# Patient Record
Sex: Female | Born: 1965
Health system: Southern US, Community
[De-identification: ages and names within clinical notes are randomized; demographics above are authoritative.]

## PROBLEM LIST (undated history)

## (undated) ENCOUNTER — Emergency Department: Admission: EM | Payer: Self-pay | Source: Home / Self Care

## (undated) DIAGNOSIS — D649 Anemia, unspecified: Secondary | ICD-10-CM

## (undated) DIAGNOSIS — T300 Burn of unspecified body region, unspecified degree: Secondary | ICD-10-CM

## (undated) HISTORY — PX: APPENDECTOMY: SHX54

## (undated) HISTORY — PX: WISDOM TOOTH EXTRACTION: SHX21

## (undated) HISTORY — PX: BURN DEBRIDEMENT SURGERY: SHX582

---

## 1998-03-11 ENCOUNTER — Other Ambulatory Visit: Admission: RE | Admit: 1998-03-11 | Discharge: 1998-03-11 | Payer: Self-pay | Admitting: Obstetrics

## 1999-08-25 ENCOUNTER — Other Ambulatory Visit: Admission: RE | Admit: 1999-08-25 | Discharge: 1999-08-25 | Payer: Self-pay | Admitting: Obstetrics

## 2000-10-05 ENCOUNTER — Encounter: Payer: Self-pay | Admitting: Obstetrics and Gynecology

## 2000-10-05 ENCOUNTER — Encounter: Admission: RE | Admit: 2000-10-05 | Discharge: 2000-10-05 | Payer: Self-pay | Admitting: Obstetrics and Gynecology

## 2000-12-08 ENCOUNTER — Encounter: Admission: RE | Admit: 2000-12-08 | Discharge: 2000-12-08 | Payer: Self-pay | Admitting: Obstetrics and Gynecology

## 2000-12-08 ENCOUNTER — Encounter: Payer: Self-pay | Admitting: Obstetrics and Gynecology

## 2001-05-12 ENCOUNTER — Encounter: Admission: RE | Admit: 2001-05-12 | Discharge: 2001-05-12 | Payer: Self-pay | Admitting: Obstetrics and Gynecology

## 2001-05-12 ENCOUNTER — Encounter: Payer: Self-pay | Admitting: Obstetrics and Gynecology

## 2001-05-12 ENCOUNTER — Observation Stay (HOSPITAL_COMMUNITY): Admission: EM | Admit: 2001-05-12 | Discharge: 2001-05-13 | Payer: Self-pay | Admitting: Emergency Medicine

## 2001-05-12 ENCOUNTER — Encounter (INDEPENDENT_AMBULATORY_CARE_PROVIDER_SITE_OTHER): Payer: Self-pay | Admitting: Specialist

## 2001-05-30 ENCOUNTER — Ambulatory Visit (HOSPITAL_COMMUNITY): Admission: RE | Admit: 2001-05-30 | Discharge: 2001-05-30 | Payer: Self-pay | Admitting: Obstetrics and Gynecology

## 2001-05-30 ENCOUNTER — Encounter (INDEPENDENT_AMBULATORY_CARE_PROVIDER_SITE_OTHER): Payer: Self-pay | Admitting: Specialist

## 2002-01-12 HISTORY — PX: APPENDECTOMY: SHX54

## 2002-03-14 ENCOUNTER — Other Ambulatory Visit: Admission: RE | Admit: 2002-03-14 | Discharge: 2002-03-14 | Payer: Self-pay | Admitting: Obstetrics and Gynecology

## 2002-11-06 ENCOUNTER — Other Ambulatory Visit: Admission: RE | Admit: 2002-11-06 | Discharge: 2002-11-06 | Payer: Self-pay | Admitting: Obstetrics and Gynecology

## 2003-01-13 HISTORY — PX: OTHER SURGICAL HISTORY: SHX169

## 2003-04-20 ENCOUNTER — Other Ambulatory Visit: Admission: RE | Admit: 2003-04-20 | Discharge: 2003-04-20 | Payer: Self-pay | Admitting: Obstetrics and Gynecology

## 2004-05-28 ENCOUNTER — Other Ambulatory Visit: Admission: RE | Admit: 2004-05-28 | Discharge: 2004-05-28 | Payer: Self-pay | Admitting: Obstetrics and Gynecology

## 2005-06-24 ENCOUNTER — Other Ambulatory Visit: Admission: RE | Admit: 2005-06-24 | Discharge: 2005-06-24 | Payer: Self-pay | Admitting: Obstetrics and Gynecology

## 2006-07-07 ENCOUNTER — Other Ambulatory Visit: Admission: RE | Admit: 2006-07-07 | Discharge: 2006-07-07 | Payer: Self-pay | Admitting: Obstetrics and Gynecology

## 2007-06-22 ENCOUNTER — Encounter: Admission: RE | Admit: 2007-06-22 | Discharge: 2007-06-22 | Payer: Self-pay | Admitting: Obstetrics and Gynecology

## 2007-08-11 ENCOUNTER — Other Ambulatory Visit: Admission: RE | Admit: 2007-08-11 | Discharge: 2007-08-11 | Payer: Self-pay | Admitting: Obstetrics and Gynecology

## 2008-09-25 ENCOUNTER — Other Ambulatory Visit: Admission: RE | Admit: 2008-09-25 | Discharge: 2008-09-25 | Payer: Self-pay | Admitting: Obstetrics and Gynecology

## 2009-10-01 ENCOUNTER — Other Ambulatory Visit: Admission: RE | Admit: 2009-10-01 | Discharge: 2009-10-01 | Payer: Self-pay | Admitting: Obstetrics and Gynecology
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
## 2010-05-30 NOTE — Op Note (Signed)
[facility]  Patient:    MIYUKI, RZASA Visit Number: [id number] MRN: [id number]          Service Type: DSU Location: [facility] Attending Physician:  Madelyn Flavors Dictated by:   Beather Arbour Thomasena Edis, M.D. Proc. Date: 05/30/01 Admit Date:  05/30/2001   CC:         Dr. Judyann Munson; [facility] [place]  Currie Paris, M.D.  Georgina Peer, M.D.   Operative Report  PREOPERATIVE DIAGNOSES:       Prolapsing fibroid, menorrhagia, anemia secondary to menorrhagia.  POSTOPERATIVE DIAGNOSES:      Prolapsing fibroid, menorrhagia, anemia secondary to menorrhagia.  PROCEDURE:                    Vaginal myomectomy.  SURGEON:                      Beather Arbour. Thomasena Edis, M.D.  ASSISTANT:                    Georgina Peer, M.D.  ESTIMATED BLOOD LOSS:         50 cc.  FLUIDS:                       1700 cc of Crystalloid and 1 g of cefazolin IV preoperatively.  COMPLICATIONS:                None.  DRAINS:                       Foley.  ANESTHESIA:                   General endotracheal.  DESCRIPTION OF OPERATION:     Patient was brought to the operating room, identified on the operating table.  After induction of adequate general endotracheal anesthesia the patient was placed in [facility] stirrups and prepped and draped in the usual sterile fashion.  She was prepped for an abdominal case as well as a vaginal case.  A Foley catheter was placed.  Vaginal wall retractors were placed.  The prolapsing fibroid was again identified and the base of the stalk was infiltrated with 10 cc of 1:100,000 lidocaine with epinephrine taking care to not inject intravascularly.  With excellent exposure, four ligating sutures were placed.  A suture was placed from 12 to 6 and then from 6 to 12 in the vertical plane and then superiorly at the 12 oclock position and then at the 6 oclock position such that the stalk was ligated with four quadrant sutures which  were placed deep.  Using the small loop on 50 watts of coagulation, the fibroid was removed at the smallest portion of the stalk after ligation.  Using the cautery tip, the base of the stalk was cauterized.  There was noted to be very minimal blood loss.  The stalk was noted then to retract up inside the uterus.  The patient did receive preoperative antibiotics at that point.  The patient was observed for several minutes to ensure that there was no bleeding.  No bleeding was seen as the stalk continued to retract up into the uterus.  At that point the procedure was terminated.  The patient tolerated the procedure well without apparent complications.  Was transferred to the recovery room in stable condition after all instrument, sponge, needle counts were correct.  Because this was a vaginal  case, assuming the patient has no bleeding, she will be allowed to be discharged home.  She was given extensive discharge instructions.  She was given a prescription for Darvocet-N 100 dispensed 20 to take one p.o. q.6h. p.r.n. pain and doxycycline 100 mg dispensed 20 to take one p.o. b.i.d. for 10 days.  In addition, she is to take ibuprofen 400-600 mg q.6h. p.r.n. for pain. She is to continue her iron and her oral contraceptives.  She absolutely must refrain from anything in her vagina until her postoperative visit in two weeks.  She is to call with heavy bleeding or any problems.  It should be noted that the specimen was sent to pathology for examination and she was given a prescription for antibiotics because the fibroid did appear to be somewhat necrotic. Dictated by:   Beather Arbour Thomasena Edis, M.D. Attending Physician:  Madelyn Flavors DD:  05/30/01 TD:  05/31/01 Job: [id number]

## 2010-05-30 NOTE — Op Note (Signed)
[facility]  Patient:    Hayley Phillips, Hayley Phillips Visit Number: [id number] MRN: [id number]          Service Type: SUR Location: 4W [id number] 01 Attending Physician:  Charlton Haws Dictated by:   Currie Paris, M.D. Proc. Date: 05/12/01 Admit Date:  05/12/2001 Discharge Date: 05/13/2001   CC:         [name], M.D.   Operative Report  VISIT NUMBER:  [id number]  PREOPERATIVE DIAGNOSIS:  Acute appendicitis, early.  POSTOPERATIVE DIAGNOSIS:  Acute appendicitis, early versus mild right salpingitis.  OPERATION:  Laparoscopic appendectomy.  SURGEON:  Currie Paris, M.D.  ANESTHESIA:  General.  CLINICAL HISTORY:  This patient is a 45 year old, who had history of about one weeks worth of cramping abdominal pain which she associated with the normal pain that she had premenstrual and menstrually.  She had heavy periods, fibroids, and was actually scheduled for an elective hysterectomy two weeks from now because of this and the chronic anemia which it produced.  Yesterday, her pain became different in quality with more periumbilical and then right lower quadrant pain and some mild anorexia, although no nausea or vomiting.  She was seen by Dr. Thomasena Edis, and a CT scan obtained which was most consistent with an early appendicitis.  White count in Dr. Thomasena Edis office was noted to be 12,000.  I did see the patient.  She had fairly diffuse abdominal discomfort but was particularly tender in the right lower quadrant.  She had mild rebound tenderness present.  CT was reviewed, and the appendix was perhaps with a little enhancement.  There was no evidence of any other problems, and we elected after discussion with the patient to proceed to appendectomy.  DESCRIPTION OF PROCEDURE:  The patient was seen in holding area and then taken into the operating room.  After satisfactory general endotracheal anesthesia had been obtained, an NG and Foley were  placed.  The abdomen was prepped and draped.  Then 0.25% plain Marcaine was used for the incisions and an umbilical incision made first.  The fascia was picked up with a Kocher and opened and the peritoneal cavity entered under direct vision.  The Hasson was introduced and the abdomen examined.  The liver and gallbladder appeared to be normal. There was a lot of small bowel overlying the right lower quadrants.  I could not really see the appendix at this point.  The uterus was markedly enlarged and consistent with what was seen on the CT scan and what was known from previous.  There was a little purulent fluid just posterior to the uterus.  A 5 mm trocar was placed in the right upper quadrant and a 10-11 in the left lower quadrant, both under direct vision and the patient placed in Trendelenburg and rotated left.  I was able to get a photograph of the uterus, tube, and ovary, and the tube did look fairly edematous, but there was no gross pus dripping out of it.  It was consistent with an early salpingitis, although this could also have been a secondary phenomenon.  The terminal ileum was noted to be attached densely to the peritoneum, and I could actually see the ureter.  As I traced that back, I was then able to identify the cecum, pick it up, saw the appendix which looked minimally edematous and also perhaps with a little bit of purulence around it.  It was grasped, and some peritoneum holding it down was divided with the harmonic  scalpel.  The appendiceal artery was identified and also divided with the harmonic scalpel until we got down to the base of the appendix.  At this point, I was able to put the GIA stapler through and divided the appendix. It was placed in a bag and brought out the umbilical port.  Final irrigation and check was made, and everything appeared to be dry.  The closure of the cecum appeared intact.  The trocars were removed under direct vision and the umbilical  port closed with the pursestring that had been placed.  The fascia at the left lower quadrant incision was also closed with a single stitch of 2-0 Vicryl.  The skin was closed with 4-0 Monocryl subcuticular plus Steri-Strips.  The patient tolerated the procedure well.  There were no operative complications.  All counts were correct. Dictated by:   Currie Paris, M.D. Attending Physician:  Charlton Haws DD:  05/12/01 TD:  05/13/01 Job: [id number]

## 2012-04-21 ENCOUNTER — Other Ambulatory Visit: Payer: Self-pay | Admitting: Obstetrics and Gynecology

## 2013-02-20 ENCOUNTER — Ambulatory Visit
Admission: RE | Admit: 2013-02-20 | Discharge: 2013-02-20 | Disposition: A | Discharge status: Home / Self Care | Payer: BC Managed Care – PPO | Source: Ambulatory Visit | Attending: Family Medicine | Admitting: Family Medicine

## 2013-02-20 ENCOUNTER — Other Ambulatory Visit: Payer: Self-pay | Admitting: Family Medicine

## 2013-02-20 DIAGNOSIS — I889 Nonspecific lymphadenitis, unspecified: Secondary | ICD-10-CM

## 2013-05-03 ENCOUNTER — Other Ambulatory Visit: Payer: Self-pay | Admitting: Obstetrics and Gynecology

## 2014-06-07 ENCOUNTER — Other Ambulatory Visit: Payer: Self-pay | Admitting: Obstetrics and Gynecology

## 2014-06-08 LAB — CYTOLOGY - PAP

## 2015-03-19 ENCOUNTER — Other Ambulatory Visit: Payer: Self-pay | Admitting: Obstetrics and Gynecology

## 2015-07-03 ENCOUNTER — Other Ambulatory Visit: Payer: Self-pay | Admitting: Obstetrics and Gynecology

## 2015-07-04 LAB — CYTOLOGY - PAP

## 2016-03-19 ENCOUNTER — Encounter (HOSPITAL_COMMUNITY): Payer: Self-pay | Admitting: Emergency Medicine

## 2016-03-19 ENCOUNTER — Emergency Department (HOSPITAL_COMMUNITY)
Admission: EM | Admit: 2016-03-19 | Discharge: 2016-03-19 | Disposition: A | Discharge status: Home / Self Care | Payer: No Typology Code available for payment source | Attending: Emergency Medicine | Admitting: Emergency Medicine

## 2016-03-19 DIAGNOSIS — Y92410 Unspecified street and highway as the place of occurrence of the external cause: Secondary | ICD-10-CM | POA: Insufficient documentation

## 2016-03-19 DIAGNOSIS — Y939 Activity, unspecified: Secondary | ICD-10-CM | POA: Diagnosis not present

## 2016-03-19 DIAGNOSIS — R51 Headache: Secondary | ICD-10-CM | POA: Diagnosis not present

## 2016-03-19 DIAGNOSIS — Z79899 Other long term (current) drug therapy: Secondary | ICD-10-CM | POA: Diagnosis not present

## 2016-03-19 DIAGNOSIS — M546 Pain in thoracic spine: Secondary | ICD-10-CM | POA: Insufficient documentation

## 2016-03-19 DIAGNOSIS — Y999 Unspecified external cause status: Secondary | ICD-10-CM | POA: Insufficient documentation

## 2016-03-19 HISTORY — DX: Burn of unspecified body region, unspecified degree: T30.0

## 2016-03-19 MED ORDER — CYCLOBENZAPRINE HCL 10 MG PO TABS: 10.0000 mg | ORAL_TABLET | Freq: Two times a day (BID) | ORAL | 0 refills | Status: DC | PRN
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
## 2016-03-19 NOTE — Discharge Instructions (Signed)
Expect to be more sore and tender the next several days. Please use flexeril twice a day as needed for muscle pain and spasm. Use Advil, ice/heat, stretching and massaging to the area everyday. Follow up with your primary care provider in one week regarding today's visit.   Get help right away if: You have: Numbness, tingling, or weakness in your arms or legs. Severe neck pain, especially tenderness in the middle of the back of your neck. Changes in bowel or bladder control. Increasing pain in any area of your body. Shortness of breath or light-headedness. Chest pain. Blood in your urine, stool, or vomit. Severe pain in your abdomen or your back. Severe or worsening headaches. Sudden vision loss or double vision. Your eye suddenly becomes red. Your pupil is an odd shape or size.

## 2016-03-19 NOTE — ED Notes (Signed)
Bed: WTR5 Expected date:  Expected time:  Means of arrival:  Comments: 
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
## 2016-07-07 DIAGNOSIS — Z124 Encounter for screening for malignant neoplasm of cervix: Secondary | ICD-10-CM | POA: Diagnosis not present

## 2016-07-07 DIAGNOSIS — Z01419 Encounter for gynecological examination (general) (routine) without abnormal findings: Secondary | ICD-10-CM | POA: Diagnosis not present

## 2016-07-07 DIAGNOSIS — Z6828 Body mass index (BMI) 28.0-28.9, adult: Secondary | ICD-10-CM | POA: Diagnosis not present

## 2016-07-07 DIAGNOSIS — Z1231 Encounter for screening mammogram for malignant neoplasm of breast: Secondary | ICD-10-CM | POA: Diagnosis not present

## 2016-07-07 DIAGNOSIS — Z1329 Encounter for screening for other suspected endocrine disorder: Secondary | ICD-10-CM | POA: Diagnosis not present

## 2016-07-07 DIAGNOSIS — Z13220 Encounter for screening for lipoid disorders: Secondary | ICD-10-CM | POA: Diagnosis not present

## 2016-07-07 DIAGNOSIS — Z113 Encounter for screening for infections with a predominantly sexual mode of transmission: Secondary | ICD-10-CM | POA: Diagnosis not present

## 2016-07-07 DIAGNOSIS — Z130 Encounter for screening for diseases of the blood and blood-forming organs and certain disorders involving the immune mechanism: Secondary | ICD-10-CM | POA: Diagnosis not present

## 2017-05-24 DIAGNOSIS — M2242 Chondromalacia patellae, left knee: Secondary | ICD-10-CM | POA: Diagnosis not present

## 2017-07-27 DIAGNOSIS — Z13220 Encounter for screening for lipoid disorders: Secondary | ICD-10-CM | POA: Diagnosis not present

## 2017-07-27 DIAGNOSIS — Z124 Encounter for screening for malignant neoplasm of cervix: Secondary | ICD-10-CM | POA: Diagnosis not present

## 2017-07-27 DIAGNOSIS — Z6828 Body mass index (BMI) 28.0-28.9, adult: Secondary | ICD-10-CM | POA: Diagnosis not present

## 2017-07-27 DIAGNOSIS — Z01419 Encounter for gynecological examination (general) (routine) without abnormal findings: Secondary | ICD-10-CM | POA: Diagnosis not present

## 2017-07-27 DIAGNOSIS — Z1329 Encounter for screening for other suspected endocrine disorder: Secondary | ICD-10-CM | POA: Diagnosis not present

## 2017-07-27 DIAGNOSIS — Z1231 Encounter for screening mammogram for malignant neoplasm of breast: Secondary | ICD-10-CM | POA: Diagnosis not present

## 2017-07-27 DIAGNOSIS — Z113 Encounter for screening for infections with a predominantly sexual mode of transmission: Secondary | ICD-10-CM | POA: Diagnosis not present

## 2018-02-17 DIAGNOSIS — H8113 Benign paroxysmal vertigo, bilateral: Secondary | ICD-10-CM | POA: Diagnosis not present

## 2018-08-01 DIAGNOSIS — Z130 Encounter for screening for diseases of the blood and blood-forming organs and certain disorders involving the immune mechanism: Secondary | ICD-10-CM | POA: Diagnosis not present

## 2018-08-01 DIAGNOSIS — Z113 Encounter for screening for infections with a predominantly sexual mode of transmission: Secondary | ICD-10-CM | POA: Diagnosis not present

## 2018-08-01 DIAGNOSIS — Z6829 Body mass index (BMI) 29.0-29.9, adult: Secondary | ICD-10-CM | POA: Diagnosis not present

## 2018-08-01 DIAGNOSIS — Z1329 Encounter for screening for other suspected endocrine disorder: Secondary | ICD-10-CM | POA: Diagnosis not present

## 2018-08-01 DIAGNOSIS — Z1231 Encounter for screening mammogram for malignant neoplasm of breast: Secondary | ICD-10-CM | POA: Diagnosis not present

## 2018-08-01 DIAGNOSIS — Z13220 Encounter for screening for lipoid disorders: Secondary | ICD-10-CM | POA: Diagnosis not present

## 2018-08-01 DIAGNOSIS — Z01419 Encounter for gynecological examination (general) (routine) without abnormal findings: Secondary | ICD-10-CM | POA: Diagnosis not present

## 2018-08-01 DIAGNOSIS — Z124 Encounter for screening for malignant neoplasm of cervix: Secondary | ICD-10-CM | POA: Diagnosis not present

## 2020-12-29 ENCOUNTER — Emergency Department: Admit: 2020-12-29 | Payer: Self-pay

## 2020-12-29 ENCOUNTER — Other Ambulatory Visit: Payer: Self-pay

## 2020-12-29 ENCOUNTER — Emergency Department (INDEPENDENT_AMBULATORY_CARE_PROVIDER_SITE_OTHER)
Admission: EM | Admit: 2020-12-29 | Discharge: 2020-12-29 | Disposition: A | Discharge status: Home / Self Care | Payer: Managed Care, Other (non HMO) | Source: Home / Self Care

## 2020-12-29 DIAGNOSIS — R42 Dizziness and giddiness: Secondary | ICD-10-CM

## 2020-12-29 MED ORDER — MECLIZINE HCL 25 MG PO TABS: 25.0000 mg | ORAL_TABLET | Freq: Three times a day (TID) | ORAL | 0 refills | Status: DC | PRN

## 2020-12-29 NOTE — ED Triage Notes (Signed)
Pt states that she has some dizziness.  Pt states that she wakes up in the morning with dizziness.   Pt states that depending on what direction she turns the dizziness gets worse. X10 days
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
## 2020-12-29 NOTE — Discharge Instructions (Addendum)
Advised patient to take medication as directed.  Encourage patient to increase daily water intake while taking this medication.  Advised patient if dizziness worsens, and/or unresolved please follow-up with PCP or here for further evaluation.

## 2020-12-30 ENCOUNTER — Ambulatory Visit: Payer: Self-pay

## 2020-12-31 ENCOUNTER — Telehealth: Payer: Self-pay | Admitting: Emergency Medicine

## 2020-12-31 MED ORDER — ONDANSETRON 4 MG PO TBDP: 4.0000 mg | ORAL_TABLET | Freq: Three times a day (TID) | ORAL | 0 refills | Status: DC | PRN

## 2020-12-31 NOTE — Telephone Encounter (Signed)
Return call to Hayley Phillips regarding cough w/ meclizine - pt asking for an alternative medicine for dizziness & nausea. Chart reviewed prior to call back. Per Dr Cathren Harsh, pt to try dramamine OTC & a prescription for zofran will also be sent to pharmacy on file. No other questions at this time

## 2021-01-06 ENCOUNTER — Emergency Department (HOSPITAL_BASED_OUTPATIENT_CLINIC_OR_DEPARTMENT_OTHER)
Admission: EM | Admit: 2021-01-06 | Discharge: 2021-01-06 | Disposition: A | Discharge status: Home / Self Care | Payer: Managed Care, Other (non HMO) | Attending: Emergency Medicine | Admitting: Emergency Medicine

## 2021-01-06 ENCOUNTER — Emergency Department (HOSPITAL_BASED_OUTPATIENT_CLINIC_OR_DEPARTMENT_OTHER): Payer: Managed Care, Other (non HMO)

## 2021-01-06 ENCOUNTER — Encounter (HOSPITAL_BASED_OUTPATIENT_CLINIC_OR_DEPARTMENT_OTHER): Payer: Self-pay

## 2021-01-06 ENCOUNTER — Other Ambulatory Visit: Payer: Self-pay

## 2021-01-06 DIAGNOSIS — Z79899 Other long term (current) drug therapy: Secondary | ICD-10-CM | POA: Diagnosis not present

## 2021-01-06 DIAGNOSIS — R509 Fever, unspecified: Secondary | ICD-10-CM | POA: Diagnosis not present

## 2021-01-06 DIAGNOSIS — Z20822 Contact with and (suspected) exposure to covid-19: Secondary | ICD-10-CM | POA: Diagnosis not present

## 2021-01-06 DIAGNOSIS — R059 Cough, unspecified: Secondary | ICD-10-CM

## 2021-01-06 LAB — RESP PANEL BY RT-PCR (FLU A&B, COVID) ARPGX2
Influenza A by PCR: NEGATIVE
Influenza B by PCR: NEGATIVE
SARS Coronavirus 2 by RT PCR: NEGATIVE

## 2021-01-06 MED ORDER — AEROCHAMBER PLUS FLO-VU LARGE MISC
1.0000 | Freq: Once | Status: DC
  Filled 2021-01-06: qty 1

## 2021-01-06 MED ORDER — BENZONATATE 100 MG PO CAPS: 100.0000 mg | ORAL_CAPSULE | Freq: Three times a day (TID) | ORAL | 0 refills | Status: DC

## 2021-01-06 MED ORDER — BENZONATATE 100 MG PO CAPS
100.0000 mg | ORAL_CAPSULE | Freq: Once | ORAL | Status: AC
  Administered 2021-01-06: 07:00:00 100 mg via ORAL
  Filled 2021-01-06: qty 1

## 2021-01-06 MED ORDER — ALBUTEROL SULFATE HFA 108 (90 BASE) MCG/ACT IN AERS
2.0000 | INHALATION_SPRAY | Freq: Once | RESPIRATORY_TRACT | Status: AC
  Administered 2021-01-06: 07:00:00 2 via RESPIRATORY_TRACT
  Filled 2021-01-06: qty 6.7

## 2021-01-06 NOTE — Discharge Instructions (Signed)
Albuterol: Take 1-2 puffs every 4-6 hours as needed for shortness of breath or wheezing.

## 2021-01-06 NOTE — ED Triage Notes (Addendum)
Patient presents with complaint of cough which began last Tuesday.  States she also had a fever of 101 last Tuesday.  She states she was spending time with her mother who tested positive for the flu on Wednesday.  Saturday she began experiencing nausea/vomiting.  Patient did not take the flu vaccine.
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
## 2022-09-16 ENCOUNTER — Other Ambulatory Visit: Payer: Self-pay | Admitting: Obstetrics and Gynecology

## 2022-09-16 DIAGNOSIS — N970 Female infertility associated with anovulation: Secondary | ICD-10-CM

## 2022-09-16 DIAGNOSIS — N938 Other specified abnormal uterine and vaginal bleeding: Secondary | ICD-10-CM

## 2022-10-05 ENCOUNTER — Encounter (HOSPITAL_BASED_OUTPATIENT_CLINIC_OR_DEPARTMENT_OTHER): Payer: Self-pay | Admitting: Obstetrics and Gynecology

## 2022-10-05 ENCOUNTER — Other Ambulatory Visit: Payer: Self-pay

## 2022-10-05 NOTE — Progress Notes (Signed)
Spoke w/ via phone for pre-op interview---pt Lab needs dos----cbc, t & s, urin preg         Lab results------ COVID test -----patient states asymptomatic no test needed Arrive at -------1015 10-13-2022 NPO after MN NO Solid Food.  Clear liquids from MN until---915 am Med rec completed Medications to take morning of surgery -----none Diabetic medication -----n/a Patient instructed no nail polish to be worn day of surgery Patient instructed to bring photo id and insurance card day of surgery Patient aware to have Driver (ride ) / caregiver   husband Hayley Phillips  for 24 hours after surgery -  Patient Special Instructions -----none Pre-Op special Instructions -----none Patient verbalized understanding of instructions that were given at this phone interview. Patient denies chest pain, sob, fever, cough at the interview.

## 2022-10-13 ENCOUNTER — Other Ambulatory Visit: Payer: Self-pay

## 2022-10-13 ENCOUNTER — Ambulatory Visit (HOSPITAL_BASED_OUTPATIENT_CLINIC_OR_DEPARTMENT_OTHER)
Admission: RE | Admit: 2022-10-13 | Discharge: 2022-10-13 | Disposition: A | Discharge status: Home / Self Care | Payer: Managed Care, Other (non HMO) | Source: Ambulatory Visit | Attending: Obstetrics and Gynecology | Admitting: Obstetrics and Gynecology

## 2022-10-13 ENCOUNTER — Ambulatory Visit (HOSPITAL_BASED_OUTPATIENT_CLINIC_OR_DEPARTMENT_OTHER): Payer: Managed Care, Other (non HMO) | Admitting: Certified Registered Nurse Anesthetist

## 2022-10-13 ENCOUNTER — Encounter (HOSPITAL_BASED_OUTPATIENT_CLINIC_OR_DEPARTMENT_OTHER): Payer: Self-pay | Admitting: Obstetrics and Gynecology

## 2022-10-13 ENCOUNTER — Encounter (HOSPITAL_BASED_OUTPATIENT_CLINIC_OR_DEPARTMENT_OTHER)
Admission: RE | Disposition: A | Discharge status: Home / Self Care | Payer: Self-pay | Source: Ambulatory Visit | Attending: Obstetrics and Gynecology

## 2022-10-13 DIAGNOSIS — D649 Anemia, unspecified: Secondary | ICD-10-CM | POA: Insufficient documentation

## 2022-10-13 DIAGNOSIS — N938 Other specified abnormal uterine and vaginal bleeding: Secondary | ICD-10-CM

## 2022-10-13 DIAGNOSIS — N950 Postmenopausal bleeding: Secondary | ICD-10-CM | POA: Insufficient documentation

## 2022-10-13 DIAGNOSIS — D259 Leiomyoma of uterus, unspecified: Secondary | ICD-10-CM | POA: Insufficient documentation

## 2022-10-13 DIAGNOSIS — Z01818 Encounter for other preprocedural examination: Secondary | ICD-10-CM

## 2022-10-13 HISTORY — PX: HYSTEROSCOPY WITH D & C: SHX1775

## 2022-10-13 HISTORY — DX: Anemia, unspecified: D64.9

## 2022-10-13 LAB — CBC
HCT: 36.6 % (ref 36.0–46.0)
Hemoglobin: 11.4 g/dL — ABNORMAL LOW (ref 12.0–15.0)
MCH: 26.6 pg (ref 26.0–34.0)
MCHC: 31.1 g/dL (ref 30.0–36.0)
MCV: 85.5 fL (ref 80.0–100.0)
Platelets: 389 10*3/uL (ref 150–400)
RBC: 4.28 MIL/uL (ref 3.87–5.11)
RDW: 17.4 % — ABNORMAL HIGH (ref 11.5–15.5)
WBC: 4.8 10*3/uL (ref 4.0–10.5)
nRBC: 0 % (ref 0.0–0.2)

## 2022-10-13 LAB — POCT PREGNANCY, URINE: Preg Test, Ur: NEGATIVE

## 2022-10-13 LAB — TYPE AND SCREEN
ABO/RH(D): O POS
Antibody Screen: NEGATIVE

## 2022-10-13 LAB — ABO/RH: ABO/RH(D): O POS

## 2022-10-13 SURGERY — DILATATION AND CURETTAGE /HYSTEROSCOPY
Anesthesia: General | Site: Uterus

## 2022-10-13 MED ORDER — ONDANSETRON HCL 4 MG/2ML IJ SOLN
INTRAMUSCULAR | Status: AC
  Filled 2022-10-13: qty 2

## 2022-10-13 MED ORDER — LACTATED RINGERS IV SOLN: INTRAVENOUS | Status: DC

## 2022-10-13 MED ORDER — POVIDONE-IODINE 10 % EX SWAB: 2.0000 | Freq: Once | CUTANEOUS | Status: DC

## 2022-10-13 MED ORDER — ONDANSETRON HCL 4 MG/2ML IJ SOLN
INTRAMUSCULAR | Status: DC | PRN
  Administered 2022-10-13: 4 mg via INTRAVENOUS

## 2022-10-13 MED ORDER — FENTANYL CITRATE (PF) 250 MCG/5ML IJ SOLN
INTRAMUSCULAR | Status: DC | PRN
  Administered 2022-10-13: 25 ug via INTRAVENOUS
  Administered 2022-10-13: 50 ug via INTRAVENOUS
  Administered 2022-10-13: 25 ug via INTRAVENOUS

## 2022-10-13 MED ORDER — LIDOCAINE HCL 1 % IJ SOLN
INTRAMUSCULAR | Status: DC | PRN
  Administered 2022-10-13: 10 mL

## 2022-10-13 MED ORDER — PROPOFOL 10 MG/ML IV BOLUS
INTRAVENOUS | Status: DC | PRN
  Administered 2022-10-13: 150 mg via INTRAVENOUS

## 2022-10-13 MED ORDER — LIDOCAINE HCL (PF) 2 % IJ SOLN
INTRAMUSCULAR | Status: AC
  Filled 2022-10-13: qty 5

## 2022-10-13 MED ORDER — LIDOCAINE 2% (20 MG/ML) 5 ML SYRINGE
INTRAMUSCULAR | Status: DC | PRN
  Administered 2022-10-13: 80 mg via INTRAVENOUS

## 2022-10-13 MED ORDER — SODIUM CHLORIDE 0.9 % IR SOLN
Status: DC | PRN
  Administered 2022-10-13: 3000 mL

## 2022-10-13 MED ORDER — MIDAZOLAM HCL 2 MG/2ML IJ SOLN
INTRAMUSCULAR | Status: AC
  Filled 2022-10-13: qty 2

## 2022-10-13 MED ORDER — TRANEXAMIC ACID-NACL 1000-0.7 MG/100ML-% IV SOLN
1000.0000 mg | INTRAVENOUS | Status: AC
  Administered 2022-10-13: 1000 mg via INTRAVENOUS

## 2022-10-13 MED ORDER — HYDROCODONE-ACETAMINOPHEN 5-325 MG PO TABS: 1.0000 | ORAL_TABLET | Freq: Four times a day (QID) | ORAL | 0 refills | Status: DC | PRN

## 2022-10-13 MED ORDER — FENTANYL CITRATE (PF) 100 MCG/2ML IJ SOLN
INTRAMUSCULAR | Status: AC
  Filled 2022-10-13: qty 2

## 2022-10-13 MED ORDER — OXYCODONE HCL 5 MG PO TABS: 5.0000 mg | ORAL_TABLET | Freq: Once | ORAL | Status: DC | PRN

## 2022-10-13 MED ORDER — MIDAZOLAM HCL 2 MG/2ML IJ SOLN
INTRAMUSCULAR | Status: DC | PRN
  Administered 2022-10-13: 2 mg via INTRAVENOUS

## 2022-10-13 MED ORDER — DEXAMETHASONE SODIUM PHOSPHATE 10 MG/ML IJ SOLN
INTRAMUSCULAR | Status: DC | PRN
  Administered 2022-10-13: 10 mg via INTRAVENOUS

## 2022-10-13 MED ORDER — FENTANYL CITRATE (PF) 100 MCG/2ML IJ SOLN: 25.0000 ug | INTRAMUSCULAR | Status: DC | PRN

## 2022-10-13 MED ORDER — OXYCODONE HCL 5 MG/5ML PO SOLN: 5.0000 mg | Freq: Once | ORAL | Status: DC | PRN

## 2022-10-13 MED ORDER — IBUPROFEN 600 MG PO TABS: 600.0000 mg | ORAL_TABLET | Freq: Four times a day (QID) | ORAL | 0 refills | Status: AC | PRN

## 2022-10-13 MED ORDER — ACETAMINOPHEN 10 MG/ML IV SOLN: 1000.0000 mg | Freq: Once | INTRAVENOUS | Status: DC | PRN

## 2022-10-13 MED ORDER — TRANEXAMIC ACID 1000 MG/10ML IV SOLN: 1000.0000 mg | Freq: Once | INTRAVENOUS | Status: DC

## 2022-10-13 MED ORDER — LABETALOL HCL 5 MG/ML IV SOLN
10.0000 mg | Freq: Once | INTRAVENOUS | Status: AC
  Administered 2022-10-13: 10 mg via INTRAVENOUS

## 2022-10-13 MED ORDER — LABETALOL HCL 5 MG/ML IV SOLN
5.0000 mg | Freq: Once | INTRAVENOUS | Status: AC
  Administered 2022-10-13: 5 mg via INTRAVENOUS

## 2022-10-13 MED ORDER — LABETALOL HCL 5 MG/ML IV SOLN
INTRAVENOUS | Status: AC
  Filled 2022-10-13: qty 4

## 2022-10-13 MED ORDER — TRANEXAMIC ACID-NACL 1000-0.7 MG/100ML-% IV SOLN
INTRAVENOUS | Status: AC
  Filled 2022-10-13: qty 100

## 2022-10-13 MED ORDER — DEXAMETHASONE SODIUM PHOSPHATE 10 MG/ML IJ SOLN
INTRAMUSCULAR | Status: AC
  Filled 2022-10-13: qty 1

## 2022-10-13 MED ORDER — KETOROLAC TROMETHAMINE 30 MG/ML IJ SOLN
INTRAMUSCULAR | Status: DC | PRN
  Administered 2022-10-13: 30 mg via INTRAVENOUS

## 2022-10-13 MED ORDER — KETOROLAC TROMETHAMINE 30 MG/ML IJ SOLN
INTRAMUSCULAR | Status: AC
  Filled 2022-10-13: qty 1

## 2022-10-13 MED ORDER — DROPERIDOL 2.5 MG/ML IJ SOLN: 0.6250 mg | Freq: Once | INTRAMUSCULAR | Status: DC | PRN

## 2022-10-13 SURGICAL SUPPLY — 19 items
DILATOR CANAL MILEX (MISCELLANEOUS) IMPLANT
DRSG TELFA 3X8 NADH STRL (GAUZE/BANDAGES/DRESSINGS) ×1 IMPLANT
GAUZE 4X4 16PLY ~~LOC~~+RFID DBL (SPONGE) ×1 IMPLANT
GLOVE SURG SS PI 6.5 STRL IVOR (GLOVE) IMPLANT
GLOVE SURG SS PI 7.0 STRL IVOR (GLOVE) IMPLANT
GOWN STRL REUS W/ TWL LRG LVL3 (GOWN DISPOSABLE) IMPLANT
GOWN STRL REUS W/TWL LRG LVL3 (GOWN DISPOSABLE) ×2 IMPLANT
IV NS IRRIG 3000ML ARTHROMATIC (IV SOLUTION) ×1 IMPLANT
KIT PROCEDURE FLUENT (KITS) ×1 IMPLANT
KIT TURNOVER CYSTO (KITS) ×1 IMPLANT
LOOP CUTTING BIPOLAR 21FR (ELECTRODE) IMPLANT
PACK VAGINAL MINOR WOMEN LF (CUSTOM PROCEDURE TRAY) ×1 IMPLANT
PAD OB MATERNITY 4.3X12.25 (PERSONAL CARE ITEMS) ×1 IMPLANT
PAD PREP 24X48 CUFFED NSTRL (MISCELLANEOUS) ×1 IMPLANT
SEAL CERVICAL OMNI LOK (ABLATOR) IMPLANT
SEAL ROD LENS SCOPE MYOSURE (ABLATOR) ×1 IMPLANT
SLEEVE SCD COMPRESS KNEE MED (STOCKING) ×1 IMPLANT
TOWEL OR 17X24 6PK STRL BLUE (TOWEL DISPOSABLE) ×1 IMPLANT
WATER STERILE IRR 500ML POUR (IV SOLUTION) ×1 IMPLANT

## 2022-10-13 NOTE — Anesthesia Procedure Notes (Signed)
Procedure Name: LMA Insertion Date/Time: 10/13/2022 12:25 PM  Performed by: Dairl Ponder, CRNAPre-anesthesia Checklist: Patient identified, Emergency Drugs available, Suction available and Patient being monitored Patient Re-evaluated:Patient Re-evaluated prior to induction Oxygen Delivery Method: Circle System Utilized Preoxygenation: Pre-oxygenation with 100% oxygen Induction Type: IV induction Ventilation: Mask ventilation without difficulty LMA: LMA inserted LMA Size: 4.0 Number of attempts: 1 Airway Equipment and Method: Bite block Placement Confirmation: positive ETCO2 Tube secured with: Tape Dental Injury: Teeth and Oropharynx as per pre-operative assessment

## 2022-10-13 NOTE — Discharge Instructions (Addendum)
D & C Home care Instructions:   Personal hygiene:  Used sanitary napkins for vaginal drainage not tampons. Shower or tub bathe the day after your procedure. No douching until bleeding stops. Always wipe from front to back after  Elimination.  Activity: Do not drive or operate any equipment today. The effects of the anesthesia are still present and drowsiness may result. Rest today, not necessarily flat bed rest, just take it easy. You may resume your normal activity in one to 2 days.  Sexual activity: No intercourse for one week or as indicated by your physician  Diet: Eat a light diet as desired this evening. You may resume a regular diet tomorrow.  Return to work: One to 2 days.  General Expectations of your surgery: Vaginal bleeding should be no heavier than a normal period. Spotting may continue up to 10 days. Mild cramps may continue for a couple of days. You may have a regular period in 2-6 weeks.  Unexpected observations call your doctor if these occur: persistent or heavy bleeding. Severe abdominal cramping or pain. Elevation of temperature greater than 100F. Inability to urinate.   Call for an appointment in one week or as directed.    Seek medical attention if you have any of the following: heavy bleeding ( saturating a pad in one hour and repeating that). Signs or symptoms of infection Unable to urinate within 6 hours of discharge from hospital Abdomen feels tight Increasing pain not relieved by pain medicine New or worsening pain in upper thighs or abdomen  Take a short, 5 minute, walk every 1-2 hours to help your blood circulate and help prevent blood clots Drink plenty of fluids to stay well hydrated. Eat a high fiber diet to help prevent constipation while taking narcotic medication, and take over the counter stool softener, per package directions, if needed for constipation.     Post Anesthesia Home Care Instructions  Activity: Get plenty of rest for the  remainder of the day. A responsible individual must stay with you for 24 hours following the procedure.  For the next 24 hours, DO NOT: -Drive a car -Advertising copywriter -Drink alcoholic beverages -Take any medication unless instructed by your physician -Make any legal decisions or sign important papers.  Meals: Start with liquid foods such as gelatin or soup. Progress to regular foods as tolerated. Avoid greasy, spicy, heavy foods. If nausea and/or vomiting occur, drink only clear liquids until the nausea and/or vomiting subsides. Call your physician if vomiting continues.  Special Instructions/Symptoms: Your throat may feel dry or sore from the anesthesia or the breathing tube placed in your throat during surgery. If this causes discomfort, gargle with warm salt water. The discomfort should disappear within 24 hours.              Post Anesthesia Home Care Instructions  Activity: Get plenty of rest for the remainder of the day. A responsible individual must stay with you for 24 hours following the procedure.  For the next 24 hours, DO NOT: -Drive a car -Advertising copywriter -Drink alcoholic beverages -Take any medication unless instructed by your physician -Make any legal decisions or sign important papers.  Meals: Start with liquid foods such as gelatin or soup. Progress to regular foods as tolerated. Avoid greasy, spicy, heavy foods. If nausea and/or vomiting occur, drink only clear liquids until the nausea and/or vomiting subsides. Call your physician if vomiting continues.  Special Instructions/Symptoms: Your throat may feel dry or sore from the anesthesia  or the breathing tube placed in your throat during surgery. If this causes discomfort, gargle with warm salt water. The discomfort should disappear within 24 hours.

## 2022-10-13 NOTE — Transfer of Care (Signed)
Immediate Anesthesia Transfer of Care Note  Patient: Hayley Phillips  Procedure(s) Performed: DILATATION AND CURETTAGE /HYSTEROSCOPY (Uterus)  Patient Location: PACU  Anesthesia Type:General  Level of Consciousness: drowsy and patient cooperative  Airway & Oxygen Therapy: Patient Spontanous Breathing  Post-op Assessment: Report given to RN and Post -op Vital signs reviewed and stable  Post vital signs: Reviewed and stable  Last Vitals:  Vitals Value Taken Time  BP 145/96 10/13/22 1300  Temp    Pulse 90 10/13/22 1302  Resp 13 10/13/22 1302  SpO2 96 % 10/13/22 1302  Vitals shown include unfiled device data.  Last Pain:  Vitals:   10/13/22 1054  TempSrc: Oral  PainSc: 0-No pain      Patients Stated Pain Goal: 6 (10/13/22 1054)  Complications: No notable events documented.

## 2022-10-13 NOTE — Op Note (Signed)
Pre-Operative Diagnosis: 1) postmenopausal bleeding 2 uterine fibroids Postoperative Diagnosis: 1) postmenopausal bleeding 2 uterine fibroids Procedure: Hysteroscopy, dilation and curettage Surgeon: Dr. Waynard Reeds Assistant: None Operative Findings: Enlarged fibroid uterus approximately 20 weeks size.  Uterus sounded to 17 cm.  Endometrial cavity wall appeared smooth with except for multiple small fibroids noted to be abutting into the cavity.  Bilateral tubal ostia could not be well-visualized due to depth of uterine cavity.  With introduction of the hysteroscope the fluid deficit jumped to approximately 700 cc rapidly.  No obvious uterine perforation was noted.  Hysteroscopic fluid remained clear during the procedure.  It was felt that this larger than expected deficit was due to the depth of the uterine cavity.  Final fluid deficit 670 cc Specimen: Endometrial curettings [id number] I/O In: 300 [I.V.:300] Out: 5 [Blood:5]   Hayley Phillips Is a 57 year old female who presents for surgical evaluation of postmenopausal bleeding. Please see the patient's history and physical for complete details of the history. Management options were discussed with the patient. R/B/A reviewed. Following appropriate informed consent was taken to the operating room. The patient was appropriately identified during a time out procedure.  General LMA anesthesia was administered and the patient was placed in the dorsal lithotomy position. The patient was prepped and draped in the normal sterile fashion. A speculum was placed into the vagina, a single-tooth tenaculum was placed on the anterior lip of the cervix, and 10 cc of 1% lidocaine was administered in a paracervical fashion. The cervix was serially dilated with Hank dilators. The uterus sounded to 17 cm.  The hysteroscope was then introduced for the above findings.  The hysteroscope was removed and a sharp curettage was performed.  This completed the surgical procedure and the  patient was transferred to the PACU in stable condition following the procedure.  All sponge, instrument, needle counts were correct.

## 2022-10-13 NOTE — Anesthesia Preprocedure Evaluation (Signed)
Anesthesia Evaluation  Patient identified by MRN, date of birth, ID band Patient awake    Reviewed: Allergy & Precautions, H&P , NPO status , Patient's Chart, lab work & pertinent test results  Airway Mallampati: II  TM Distance: >3 FB Neck ROM: Full    Dental no notable dental hx.    Pulmonary neg pulmonary ROS   Pulmonary exam normal breath sounds clear to auscultation       Cardiovascular negative cardio ROS Normal cardiovascular exam Rhythm:Regular Rate:Normal     Neuro/Psych negative neurological ROS  negative psych ROS   GI/Hepatic negative GI ROS, Neg liver ROS,,,  Endo/Other  negative endocrine ROS    Renal/GU negative Renal ROS  negative genitourinary   Musculoskeletal negative musculoskeletal ROS (+)    Abdominal   Peds negative pediatric ROS (+)  Hematology  (+) Blood dyscrasia, anemia   Anesthesia Other Findings   Reproductive/Obstetrics negative OB ROS                             Anesthesia Physical Anesthesia Plan  ASA: 2  Anesthesia Plan: General   Post-op Pain Management:    Induction: Intravenous  PONV Risk Score and Plan: Ondansetron and Dexamethasone  Airway Management Planned: LMA  Additional Equipment:   Intra-op Plan:   Post-operative Plan: Extubation in OR  Informed Consent: I have reviewed the patients History and Physical, chart, labs and discussed the procedure including the risks, benefits and alternatives for the proposed anesthesia with the patient or authorized representative who has indicated his/her understanding and acceptance.     Dental advisory given  Plan Discussed with: CRNA  Anesthesia Plan Comments:        Anesthesia Quick Evaluation
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
## 2022-10-13 NOTE — Anesthesia Postprocedure Evaluation (Signed)
Anesthesia Post Note  Patient: Hayley Phillips  Procedure(s) Performed: DILATATION AND CURETTAGE /HYSTEROSCOPY (Uterus)     Patient location during evaluation: PACU Anesthesia Type: General Level of consciousness: awake and alert Pain management: pain level controlled Vital Signs Assessment: post-procedure vital signs reviewed and stable Respiratory status: spontaneous breathing, nonlabored ventilation, respiratory function stable and patient connected to nasal cannula oxygen Cardiovascular status: blood pressure returned to baseline and stable Postop Assessment: no apparent nausea or vomiting Anesthetic complications: no   No notable events documented.  Last Vitals:  Vitals:   10/13/22 1415 10/13/22 1426  BP: (!) 159/97   Pulse: 81 78  Resp: (!) 21 19  Temp:    SpO2: 95% 99%    Last Pain:  Vitals:   10/13/22 1453  TempSrc:   PainSc: 3                  [place]

## 2022-10-15 ENCOUNTER — Encounter (HOSPITAL_BASED_OUTPATIENT_CLINIC_OR_DEPARTMENT_OTHER): Payer: Self-pay | Admitting: Obstetrics and Gynecology

## 2022-10-19 LAB — SURGICAL PATHOLOGY

## 2023-07-25 ENCOUNTER — Other Ambulatory Visit: Payer: Self-pay

## 2023-07-25 ENCOUNTER — Ambulatory Visit: Admission: EM | Admit: 2023-07-25 | Discharge: 2023-07-25 | Disposition: A | Discharge status: Home / Self Care

## 2023-07-25 DIAGNOSIS — J309 Allergic rhinitis, unspecified: Secondary | ICD-10-CM

## 2023-07-25 DIAGNOSIS — J069 Acute upper respiratory infection, unspecified: Secondary | ICD-10-CM

## 2023-07-25 DIAGNOSIS — R059 Cough, unspecified: Secondary | ICD-10-CM | POA: Diagnosis not present

## 2023-07-25 LAB — POC SARS CORONAVIRUS 2 AG -  ED: SARS Coronavirus 2 Ag: NEGATIVE

## 2023-07-25 MED ORDER — PREDNISONE 50 MG PO TABS: ORAL_TABLET | ORAL | 0 refills | Status: AC

## 2023-07-25 MED ORDER — HYDROCODONE BIT-HOMATROP MBR 5-1.5 MG/5ML PO SOLN: 5.0000 mL | Freq: Four times a day (QID) | ORAL | 0 refills | Status: AC | PRN

## 2023-07-25 MED ORDER — FEXOFENADINE HCL 180 MG PO TABS: 180.0000 mg | ORAL_TABLET | Freq: Every day | ORAL | 0 refills | Status: AC

## 2023-07-25 MED ORDER — AMOXICILLIN-POT CLAVULANATE 875-125 MG PO TABS: 1.0000 | ORAL_TABLET | Freq: Two times a day (BID) | ORAL | 0 refills | Status: AC

## 2023-07-25 NOTE — Discharge Instructions (Addendum)
 Advised patient COVID-19 was negative today.  Advised take medications as directed with food to completion.  Advised take prednisone  and Allegra  with first dose of Augmentin  for the next 5 of 7 days.  Advised may use Allegra  as needed afterwards for concurrent postnasal drainage/drip.  Advised may use Hycodan cough syrup at night prior to sleep for cough due to sedative effects.  Encouraged to increase daily water intake to 64 ounces per day while taking these medications.  Advised if symptoms worsen and/or unresolved please follow-up with your PCP or here for further evaluation.
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
## 2023-07-25 NOTE — ED Triage Notes (Signed)
 Pt presents to [facility] with co cough and congestion since Thursday. Coworker sick at work and she thinks she got it from here. Pt has been taking musinex.

## 2023-07-30 IMAGING — CR DG CHEST 2V
2 series · 2 of 2 positions shown · non-contrast
Comparison: 02/20/2013

CLINICAL DATA: Cough

EXAM:
CHEST - 2 VIEW

[w chest pa]
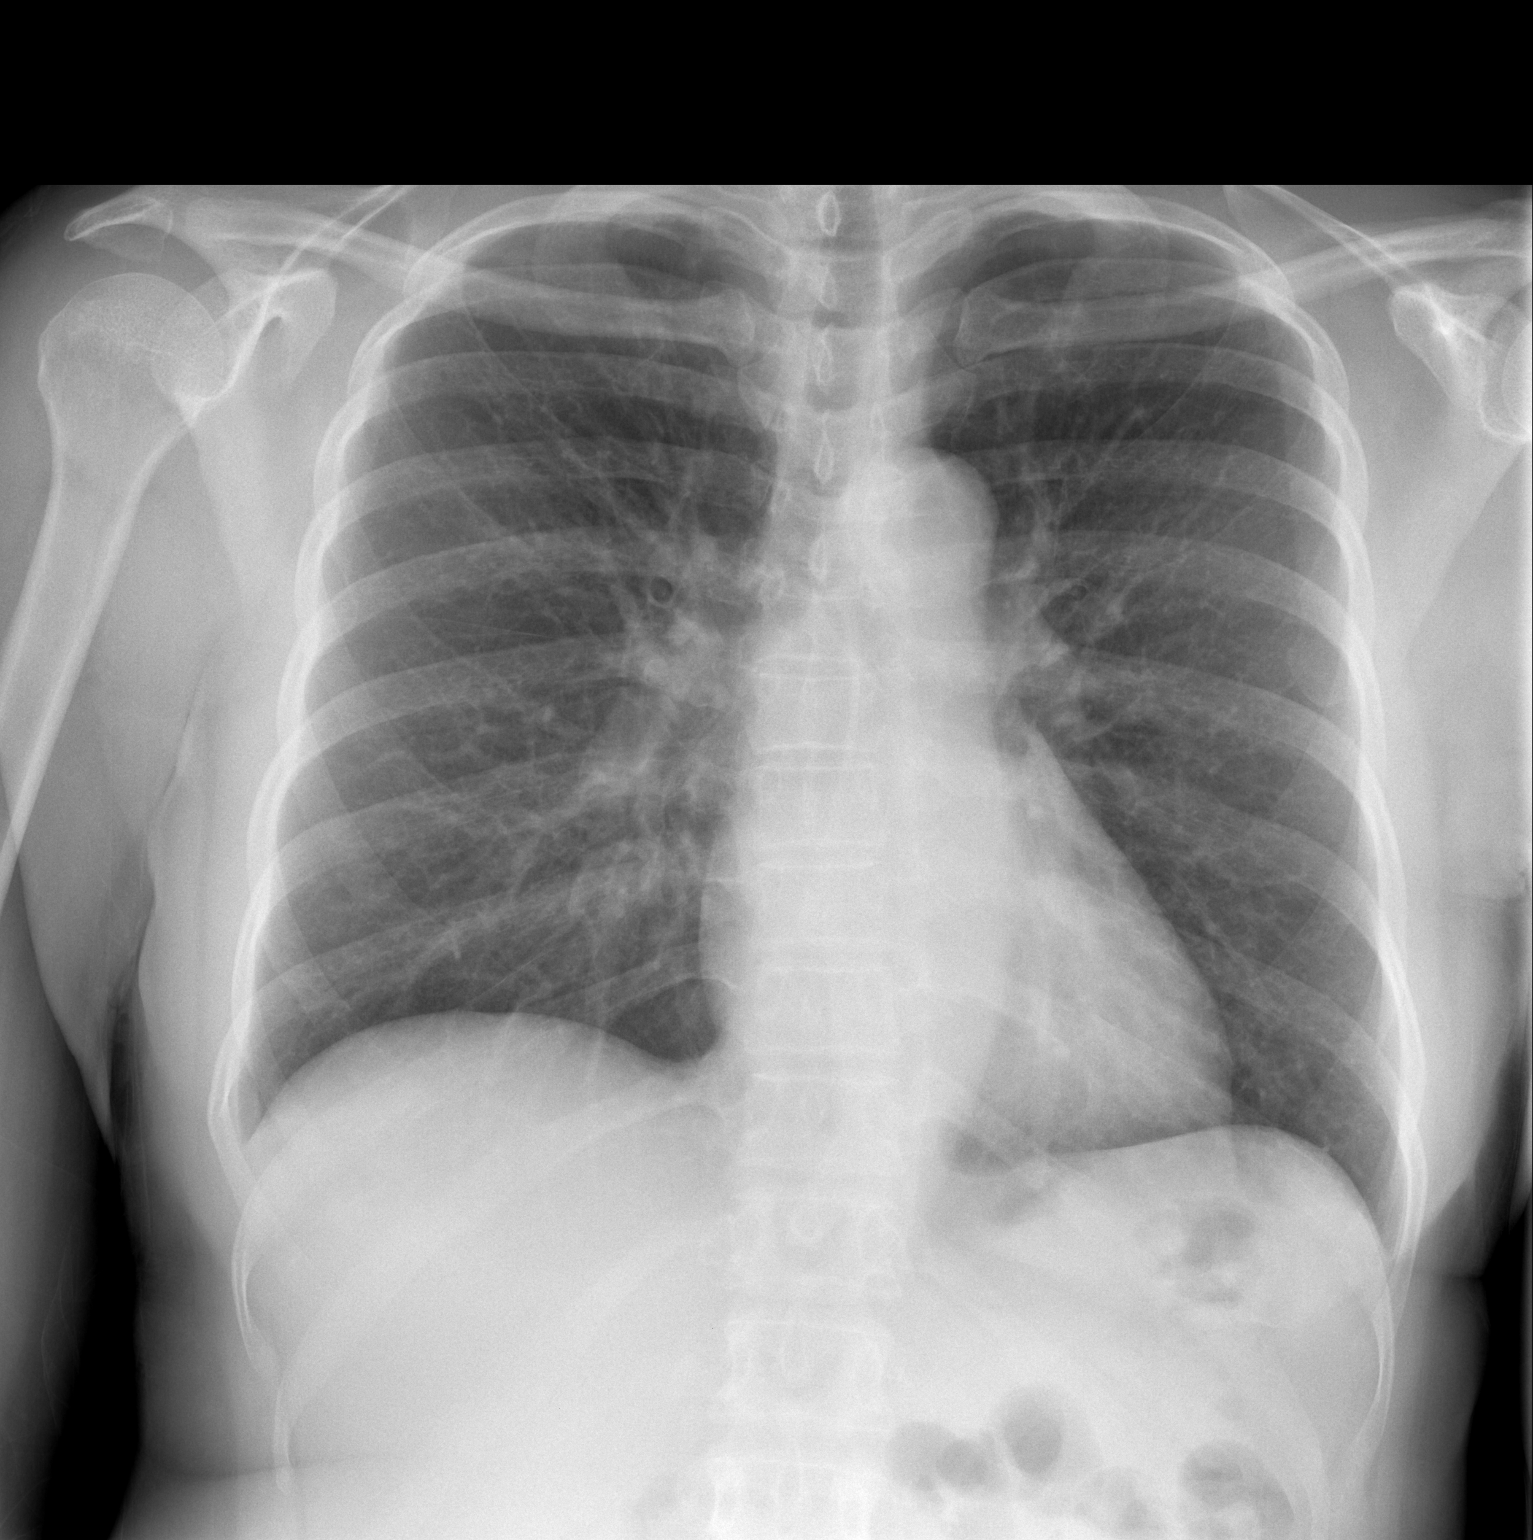

[w chest lat]
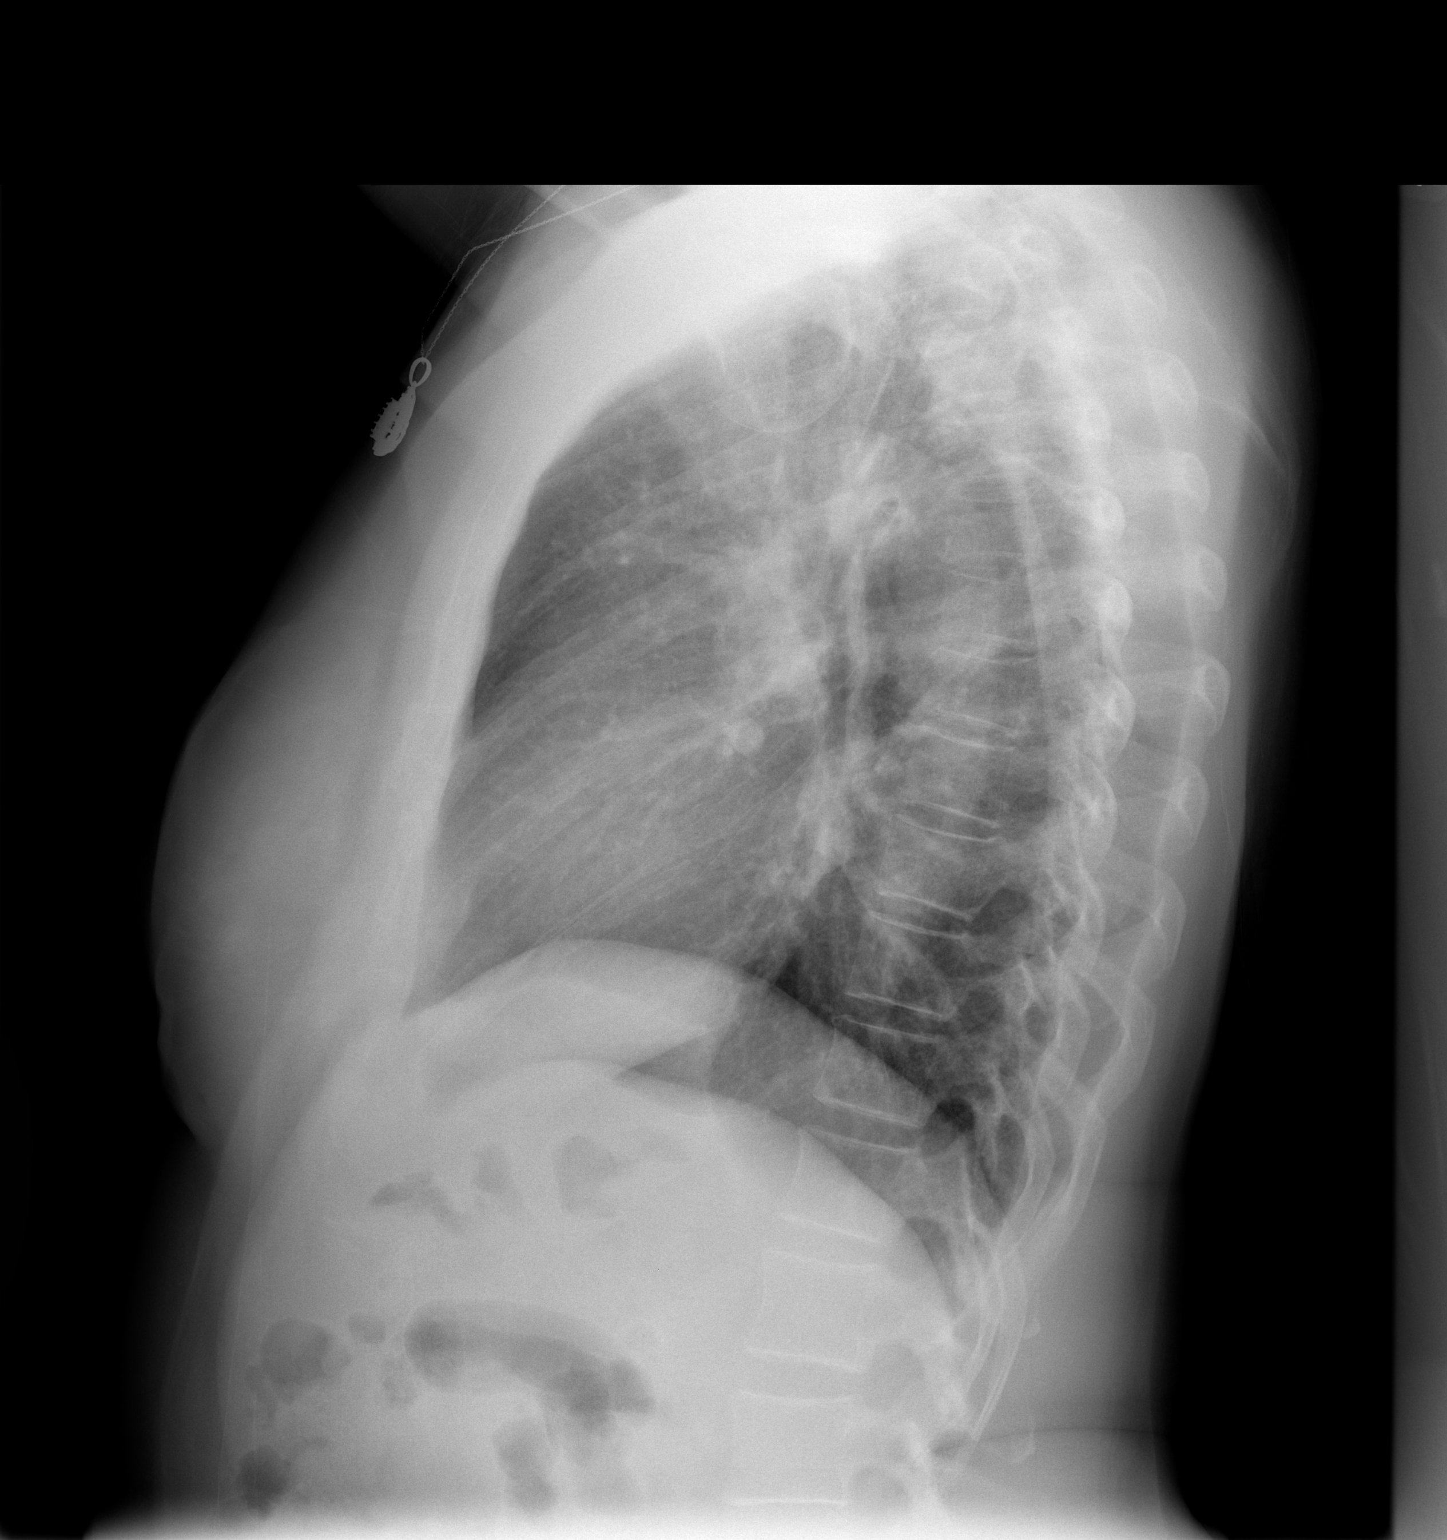

[2 of 2 positions shown; findings below may reference images not displayed]

FINDINGS: Lungs are clear.  No pleural effusion or pneumothorax.

The heart is normal in size.

Visualized osseous structures are within normal limits.
IMPRESSION: Normal chest radiographs.

## 2023-10-21 ENCOUNTER — Encounter: Payer: Self-pay | Admitting: Neurology

## 2023-10-25 ENCOUNTER — Other Ambulatory Visit: Payer: Self-pay

## 2023-10-25 DIAGNOSIS — R202 Paresthesia of skin: Secondary | ICD-10-CM

## 2023-12-20 ENCOUNTER — Ambulatory Visit: Admitting: Neurology

## 2023-12-20 DIAGNOSIS — R202 Paresthesia of skin: Secondary | ICD-10-CM | POA: Diagnosis not present

## 2023-12-20 DIAGNOSIS — G5603 Carpal tunnel syndrome, bilateral upper limbs: Secondary | ICD-10-CM

## 2023-12-20 NOTE — Procedures (Signed)
 [facility]  [address] [place], [address]  [place], [place] [address] Tel: [phone] Fax: [phone] Test Date:  12/20/2023  Patient: Hayley Phillips DOB: 1965/12/24 Physician: Venetia Potters, MD  Sex: Female Height: 5' 0 Ref Phys: Arley Helling, MD  ID#: [id number] Temp: 35.2C Technician:    History: This is a 58 year old female with bilateral hand numbness, tingling, and weakness.  NCV & EMG Findings: Extensive electrodiagnostic evaluation of bilateral upper limbs shows: Right median sensory response is absent. Left median sensory response shows prolonged distal peak latency (4.1 ms) and reduced amplitude (12 V). Bilateral ulnar and radial sensory responses are within normal limits. Bilateral median (APB) motor responses show showed prolonged distal onset latency (L4.2, R5.1 ms) and reduced amplitude (L5.3, R1.68 mV). Bilateral ulnar (ADM) motor responses are within normal limits. Chronic motor axon loss changes with accompanying active denervation changes are seen in the right abductor pollicis brevis (APB) muscle. All other tested muscles are within normal limits.  Impression: This is an abnormal study. The findings are most consistent with the following: Bilateral median mononeuropathy at or distal to the wrist, consistent with carpal tunnel syndrome. The findings are severe in degree electrically on the right side with active denervation of the right APB. The findings are moderate in degree electrically on the left side. No electrodiagnostic evidence of right or left cervical (C5-T1) motor radiculopathy. Screening studies for right or left ulnar or radial mononeuropathies are within normal limits.    ___________________________ Venetia Potters, MD    Nerve Conduction Studies Motor Nerve Results    Latency Amplitude F-Lat Segment Distance CV Comment  Site (ms) Norm (mV) Norm (ms)  (cm) (m/s) Norm   Left Median (APB) Motor  Wrist *4.2  < 4.0 *5.3  > 6.0        Elbow 7.7  - 4.9 -  Elbow-Wrist 24 69  > 50   Right Median (APB) Motor  Wrist *5.1  < 4.0 *1.68  > 6.0        Elbow 9.1 - 1.60 -  Elbow-Wrist 26.5 66  > 50   Left Ulnar (ADM) Motor  Wrist 1.60  < 3.1 9.6  > 7.0        Bel elbow 4.9 - 9.6 -  Bel elbow-Wrist 19.5 59  > 50   Ab elbow 6.6 - 9.6 -  Ab elbow-Bel elbow 10 59 -   Right Ulnar (ADM) Motor  Wrist 1.75  < 3.1 8.4  > 7.0        Bel elbow 4.4 - 8.3 -  Bel elbow-Wrist 19.5 72  > 50   Ab elbow 5.7 - 8.0 -  Ab elbow-Bel elbow 10 77 -    Sensory Sites    Neg Peak Lat Amplitude (O-P) Segment Distance Velocity Comment  Site (ms) Norm (V) Norm  (cm) (ms)   Left Median Sensory  Wrist-Dig II *4.1  < 3.6 *12  > 15 Wrist-Dig II 13    Right Median Sensory  Wrist-Dig II *NR  < 3.6   *NR  > 15 Wrist-Dig II 13    Left Radial Sensory  Forearm-Wrist 1.88  < 2.7 19  > 14 Forearm-Wrist 10    Right Radial Sensory  Forearm-Wrist 1.90  < 2.7 32  > 14 Forearm-Wrist 10    Left Ulnar Sensory  Wrist-Dig V 2.4  < 3.1 37  > 10 Wrist-Dig V 11    Right Ulnar Sensory  Wrist-Dig V 2.6  <  3.1 38  > 10 Wrist-Dig V 11     Electromyography   Side Muscle Ins.Act Fibs Fasc Recrt Amp Dur Poly Activation Comment  Right FDI Nml Nml Nml Nml Nml Nml Nml Nml N/A  Right EIP Nml Nml Nml Nml Nml Nml Nml Nml N/A  Right Pronator teres Nml Nml Nml Nml Nml Nml Nml Nml N/A  Right APB Nml *1+ Nml *3- *1+ *1+ *1+ Nml N/A  Right Biceps Nml Nml Nml Nml Nml Nml Nml Nml N/A  Right Triceps Nml Nml Nml Nml Nml Nml Nml Nml N/A  Left FDI Nml Nml Nml Nml Nml Nml Nml Nml N/A  Left EIP Nml Nml Nml Nml Nml Nml Nml Nml N/A  Left Pronator teres Nml Nml Nml Nml Nml Nml Nml Nml N/A  Left APB Nml Nml Nml Nml Nml Nml Nml Nml N/A  Left Biceps Nml Nml Nml Nml Nml Nml Nml Nml N/A  Left Triceps Nml Nml Nml Nml Nml Nml Nml Nml N/A      Waveforms:  Motor           Sensory

## 2023-12-28 ENCOUNTER — Encounter: Admitting: Neurology
